# Patient Record
Sex: Male | Born: 2000 | Race: White | Hispanic: No | Marital: Single | Smoking: Never smoker
Health system: Southern US, Community
[De-identification: ages and names within clinical notes are randomized; demographics above are authoritative.]

## PROBLEM LIST (undated history)

## (undated) DIAGNOSIS — K219 Gastro-esophageal reflux disease without esophagitis: Secondary | ICD-10-CM

## (undated) DIAGNOSIS — F419 Anxiety disorder, unspecified: Secondary | ICD-10-CM

## (undated) HISTORY — DX: Gastro-esophageal reflux disease without esophagitis: K21.9

## (undated) HISTORY — PX: NO PAST SURGERIES: SHX2092

## (undated) HISTORY — DX: Anxiety disorder, unspecified: F41.9

---
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
## 2021-07-21 ENCOUNTER — Other Ambulatory Visit: Payer: Self-pay

## 2021-07-21 ENCOUNTER — Emergency Department (HOSPITAL_COMMUNITY): Payer: Self-pay

## 2021-07-21 ENCOUNTER — Encounter (HOSPITAL_COMMUNITY): Payer: Self-pay

## 2021-07-21 ENCOUNTER — Emergency Department (HOSPITAL_COMMUNITY)
Admission: EM | Admit: 2021-07-21 | Discharge: 2021-07-22 | Disposition: A | Discharge status: Home / Self Care | Payer: Self-pay | Attending: Emergency Medicine | Admitting: Emergency Medicine

## 2021-07-21 DIAGNOSIS — R1012 Left upper quadrant pain: Secondary | ICD-10-CM | POA: Insufficient documentation

## 2021-07-21 DIAGNOSIS — R110 Nausea: Secondary | ICD-10-CM | POA: Insufficient documentation

## 2021-07-21 LAB — CBC WITH DIFFERENTIAL/PLATELET
Abs Immature Granulocytes: 0.01 10*3/uL (ref 0.00–0.07)
Basophils Absolute: 0 10*3/uL (ref 0.0–0.1)
Basophils Relative: 1 %
Eosinophils Absolute: 0.1 10*3/uL (ref 0.0–0.5)
Eosinophils Relative: 2 %
HCT: 42.9 % (ref 39.0–52.0)
Hemoglobin: 15.1 g/dL (ref 13.0–17.0)
Immature Granulocytes: 0 %
Lymphocytes Relative: 32 %
Lymphs Abs: 1.7 10*3/uL (ref 0.7–4.0)
MCH: 30.9 pg (ref 26.0–34.0)
MCHC: 35.2 g/dL (ref 30.0–36.0)
MCV: 87.9 fL (ref 80.0–100.0)
Monocytes Absolute: 0.5 10*3/uL (ref 0.1–1.0)
Monocytes Relative: 9 %
Neutro Abs: 3 10*3/uL (ref 1.7–7.7)
Neutrophils Relative %: 56 %
Platelets: 289 10*3/uL (ref 150–400)
RBC: 4.88 MIL/uL (ref 4.22–5.81)
RDW: 12.5 % (ref 11.5–15.5)
WBC: 5.4 10*3/uL (ref 4.0–10.5)
nRBC: 0 % (ref 0.0–0.2)

## 2021-07-21 LAB — URINALYSIS, ROUTINE W REFLEX MICROSCOPIC
Bilirubin Urine: NEGATIVE
Glucose, UA: NEGATIVE mg/dL
Hgb urine dipstick: NEGATIVE
Ketones, ur: NEGATIVE mg/dL
Leukocytes,Ua: NEGATIVE
Nitrite: NEGATIVE
Protein, ur: NEGATIVE mg/dL
Specific Gravity, Urine: 1.009 (ref 1.005–1.030)
pH: 7 (ref 5.0–8.0)

## 2021-07-21 LAB — COMPREHENSIVE METABOLIC PANEL
ALT: 10 U/L (ref 0–44)
AST: 15 U/L (ref 15–41)
Albumin: 4.6 g/dL (ref 3.5–5.0)
Alkaline Phosphatase: 55 U/L (ref 38–126)
Anion gap: 11 (ref 5–15)
BUN: 9 mg/dL (ref 6–20)
CO2: 26 mmol/L (ref 22–32)
Calcium: 9.7 mg/dL (ref 8.9–10.3)
Chloride: 101 mmol/L (ref 98–111)
Creatinine, Ser: 0.94 mg/dL (ref 0.61–1.24)
GFR, Estimated: >60 mL/min (ref >60)
Glucose, Bld: 95 mg/dL (ref 70–99)
Potassium: 4.1 mmol/L (ref 3.5–5.1)
Sodium: 138 mmol/L (ref 135–145)
Total Bilirubin: 0.8 mg/dL (ref 0.3–1.2)
Total Protein: 7 g/dL (ref 6.5–8.1)

## 2021-07-21 LAB — LIPASE, BLOOD: Lipase: 24 U/L (ref 11–51)

## 2021-07-21 MED ORDER — ONDANSETRON 4 MG PO TBDP
4.0000 mg | ORAL_TABLET | Freq: Once | ORAL | Status: AC
  Administered 2021-07-22: 4 mg via ORAL
  Filled 2021-07-21: qty 1

## 2021-07-21 NOTE — ED Triage Notes (Signed)
Pt reports LUQ abd pain x 1 week, worse after eating, associated with nausea. Denies emesis/diarrhea.

## 2021-07-21 NOTE — ED Provider Triage Note (Signed)
Emergency Medicine Provider Triage Evaluation Note  Wesley Shaw , a 21 y.o. male  was evaluated in triage.  Pt complains of left upper quadrant abdominal pain.  He states that this has been ongoing for 1 week.  He describes it as constant and severe.  He states that the pain is stabbing and intermittently radiates to his left back.  Has had nausea without vomiting or diarrhea.  Denies any trauma to the abdomen.  Denies fevers.  He does endorse a cough that began about 1 week ago but is dry.  He denies any shortness of breath or palpitations or chest pain.  Denies alcohol use, chronic Tylenol or NSAID use.  Review of Systems  Positive: See above Negative:   Physical Exam  BP (!) 135/102 (BP Location: Right Arm)   Pulse 81   Temp 98.4 F (36.9 C) (Oral)   Resp 18   Ht 6' (1.829 m)   Wt 68 kg   SpO2 100%   BMI 20.34 kg/m  Gen:   Awake, no distress   Resp:  Normal effort, lungs clear in the bases. MSK:   Moves slowly Other:  Abdomen is flat and soft.  Bowel sounds are present.  He does have tenderness to palpation of the left upper quadrant over the left ribs  Medical Decision Making  Medically screening exam initiated at 10:33 PM.  Appropriate orders placed.  Macai Sisneros was informed that the remainder of the evaluation will be completed by another provider, this initial triage assessment does not replace that evaluation, and the importance of remaining in the ED until their evaluation is complete.     Cristopher Peru, PA-C 07/21/21 2235

## 2021-07-22 ENCOUNTER — Emergency Department (HOSPITAL_COMMUNITY): Payer: Self-pay

## 2021-07-22 LAB — CBG MONITORING, ED: Glucose-Capillary: 72 mg/dL (ref 70–99)

## 2021-07-22 MED ORDER — SUCRALFATE 1 G PO TABS: 1.0000 g | ORAL_TABLET | Freq: Three times a day (TID) | ORAL | 0 refills | Status: DC

## 2021-07-22 MED ORDER — DICYCLOMINE HCL 20 MG PO TABS: 20.0000 mg | ORAL_TABLET | Freq: Three times a day (TID) | ORAL | 0 refills | Status: DC | PRN

## 2021-07-22 MED ORDER — IOHEXOL 300 MG/ML  SOLN
100.0000 mL | Freq: Once | INTRAMUSCULAR | Status: AC | PRN
  Administered 2021-07-22: 100 mL via INTRAVENOUS

## 2021-07-22 MED ORDER — ONDANSETRON 4 MG PO TBDP: 4.0000 mg | ORAL_TABLET | Freq: Three times a day (TID) | ORAL | 0 refills | Status: DC | PRN

## 2021-07-22 NOTE — Discharge Instructions (Signed)

## 2021-07-22 NOTE — ED Notes (Signed)
Patient transported to CT scan . 

## 2021-07-22 NOTE — ED Provider Notes (Signed)
Emergency Department Provider Note   I have reviewed the triage vital signs and the nursing notes.   HISTORY  Chief Complaint Abdominal Pain   HPI Wesley Shaw is a 21 y.o. male presents to the emergency department with left upper quadrant abdominal pain and pain with swallowing.  Patient describes symptoms worsening over the past week and worse with eating.  He has some associated nausea but no vomiting or diarrhea.  Notes that he has had similar symptoms in the past and has had prior work-ups including CT imaging in August and upper endoscopy which [place].  He has established with GI locally and actually saw them recently with plan for reevaluation in August for possible upper endoscopy.  Notes previous diagnosis of small hiatal hernia.  He is currently taking Protonix as prescribed.  No fevers or chills.  No chest discomfort or shortness of breath.   History reviewed. No pertinent past medical history.  Review of Systems  Constitutional: No fever/chills Eyes: No visual changes. ENT: No sore throat. Cardiovascular: Denies chest pain. Respiratory: Denies shortness of breath. Gastrointestinal: Positive LUQ abdominal pain. Positive nausea, no vomiting.  No diarrhea.  No constipation. Genitourinary: Negative for dysuria. Musculoskeletal: Negative for back pain. Skin: Negative for rash. Neurological: Negative for headaches, focal weakness or numbness.   ____________________________________________   PHYSICAL EXAM:  VITAL SIGNS: ED Triage Vitals  Enc Vitals Group     BP 07/21/21 2231 (!) 135/102     Pulse Rate 07/21/21 2231 81     Resp 07/21/21 2231 18     Temp 07/21/21 2231 98.4 F (36.9 C)     Temp Source 07/21/21 2231 Oral     SpO2 07/21/21 2231 100 %     Weight 07/21/21 2232 150 lb (68 kg)     Height 07/21/21 2232 6' (1.829 m)   Constitutional: Alert and oriented. Well appearing and in no acute distress. Eyes:  Conjunctivae are normal. Head: Atraumatic. Nose: No congestion/rhinnorhea. Mouth/Throat: Mucous membranes are moist.  Neck: No stridor.   Cardiovascular: Normal rate, regular rhythm. Good peripheral circulation. Grossly normal heart sounds.   Respiratory: Normal respiratory effort.  No retractions. Lungs CTAB. Gastrointestinal: Soft with mild LUQ tenderness. No peritonitis. No distention.  Musculoskeletal: No lower extremity tenderness nor edema. No gross deformities of extremities. Neurologic:  Normal speech and language.  Skin:  Skin is warm, dry and intact. No rash noted.   ____________________________________________   LABS (all labs ordered are listed, but only abnormal results are displayed)  Labs Reviewed  COMPREHENSIVE METABOLIC PANEL  LIPASE, BLOOD  CBC WITH DIFFERENTIAL/PLATELET  URINALYSIS, ROUTINE W REFLEX MICROSCOPIC  CBG MONITORING, ED   ____________________________________________  RADIOLOGY  CT ABDOMEN PELVIS W CONTRAST  Result Date: 07/22/2021 CLINICAL DATA:  21 year old male with history of acute onset of left upper quadrant abdominal pain. EXAM: CT ABDOMEN AND PELVIS WITH CONTRAST TECHNIQUE: Multidetector CT imaging of the abdomen and pelvis was performed using the standard protocol following bolus administration of intravenous contrast. RADIATION DOSE REDUCTION: This exam was performed according to the departmental dose-optimization program which includes automated exposure control, adjustment of the mA and/or kV according to patient size and/or use of iterative reconstruction technique. CONTRAST:  OMNIPAQUE IOHEXOL 300 MG/ML  SOLN COMPARISON:  No priors. FINDINGS: Lower chest: Unremarkable. Hepatobiliary: No suspicious cystic or solid hepatic lesions. No intra or extrahepatic biliary ductal dilatation. Gallbladder is normal in appearance. Pancreas: No pancreatic mass. No pancreatic ductal dilatation. No pancreatic  or peripancreatic fluid collections or  inflammatory changes. Spleen: Unremarkable. Adrenals/Urinary Tract: Bilateral kidneys and adrenal glands are normal in appearance. No hydroureteronephrosis. Urinary bladder is normal in appearance. Stomach/Bowel: The appearance of the stomach is normal. There is no pathologic dilatation of small bowel or colon. Normal appendix. Vascular/Lymphatic: No significant atherosclerotic disease, aneurysm or dissection noted in the abdominal or pelvic vasculature. No lymphadenopathy noted in the abdomen or pelvis. Reproductive: Prostate gland and seminal vesicles are unremarkable in appearance. Other: No significant volume of ascites.  No pneumoperitoneum. Musculoskeletal: There are no aggressive appearing lytic or blastic lesions noted in the visualized portions of the skeleton. IMPRESSION: 1. No acute findings are noted in the abdomen or pelvis to account for the patient's symptoms. Electronically Signed   By: Vinnie Langton M.D.   On: 07/22/2021 06:08   DG Chest 1 View  Result Date: 07/21/2021 CLINICAL DATA:  Cough.  Lower chest and abdominal pain. EXAM: CHEST  1 VIEW COMPARISON:  None Available. FINDINGS: The lungs are hyperinflated. No focal airspace disease. Heart is normal in size with normal mediastinal contours. No pneumothorax or large pleural effusion. Blunting of the costophrenic angles typically due to hyperinflation. No acute osseous abnormalities are seen. IMPRESSION: Hyperinflation suggesting asthma or bronchitis. Electronically Signed   By: Keith Rake M.D.   On: 07/21/2021 23:05    ____________________________________________   PROCEDURES  Procedure(s) performed:   Procedures  None ____________________________________________   INITIAL IMPRESSION / ASSESSMENT AND PLAN / ED COURSE  Pertinent labs & imaging results that were available during my care of the patient were reviewed by me and considered in my medical decision making (see chart for details).   This patient is Presenting  for Evaluation of abdominal pain, which does require a range of treatment options, and is a complaint that involves a high risk of morbidity and mortality.  The Differential Diagnoses includes but is not exclusive to acute cholecystitis, intrathoracic causes for epigastric abdominal pain, gastritis, duodenitis, pancreatitis, small bowel or large bowel obstruction, abdominal aortic aneurysm, hernia, gastritis, etc.   Critical Interventions-    Medications  ondansetron (ZOFRAN-ODT) disintegrating tablet 4 mg (4 mg Oral Given 07/22/21 0355)  iohexol (OMNIPAQUE) 300 MG/ML solution 100 mL (100 mLs Intravenous Contrast Given 07/22/21 0533)    Reassessment after intervention: Symptoms improved.    I decided to review pertinent External Data, and in summary patient's last ED visit was 2015 with abdominal pain as CC at that time as well.   Clinical Laboratory Tests Ordered, included normal lipase, LFTs, bilirubin.  UA negative.  No leukocytosis or anemia.  No acute kidney injury.  Radiologic Tests Ordered, included CXR and CT abdomen/pelvis. I independently interpreted the images and agree with radiology interpretation.   Cardiac Monitor Tracing which shows NSR.   Social Determinants of Health Risk no smoking.   Medical Decision Making: Summary:  Patient presents emergency department with abdominal pain and nausea, worse with eating.  Seems most consistent with esophagitis/gastritis but does have some focal tenderness on abdominal exam.  No recent abdominal imaging in our system.  Plan for CT which, as above, no acute findings.   Reevaluation with update and discussion with patient.  Plan for symptom management and close follow-up with GI. Discussed strict ED return precautions.   Considered admission but labs and imaging are reassuring.  Patient with GI follow-up as an outpatient.  Disposition: discharge  ____________________________________________  FINAL CLINICAL IMPRESSION(S) / ED  DIAGNOSES  Final diagnoses:  Left upper quadrant abdominal  pain     NEW OUTPATIENT MEDICATIONS STARTED DURING THIS VISIT:  New Prescriptions   DICYCLOMINE (BENTYL) 20 MG TABLET    Take 1 tablet (20 mg total) by mouth 3 (three) times daily as needed for spasms.   ONDANSETRON (ZOFRAN-ODT) 4 MG DISINTEGRATING TABLET    Take 1 tablet (4 mg total) by mouth every 8 (eight) hours as needed for nausea or vomiting.   SUCRALFATE (CARAFATE) 1 G TABLET    Take 1 tablet (1 g total) by mouth 4 (four) times daily -  with meals and at bedtime for 7 days.    Note:  This document was prepared using Dragon voice recognition software and may include unintentional dictation errors.  Alona Bene, MD, [facility] Emergency Medicine    Taelyr Jantz, Arlyss Repress, MD 07/22/21 [phone]

## 2023-12-20 ENCOUNTER — Other Ambulatory Visit: Payer: Self-pay

## 2023-12-20 ENCOUNTER — Emergency Department (HOSPITAL_BASED_OUTPATIENT_CLINIC_OR_DEPARTMENT_OTHER)
Admission: EM | Admit: 2023-12-20 | Discharge: 2023-12-20 | Disposition: A | Discharge status: Home / Self Care | Payer: Self-pay | Attending: Emergency Medicine | Admitting: Emergency Medicine

## 2023-12-20 DIAGNOSIS — R197 Diarrhea, unspecified: Secondary | ICD-10-CM

## 2023-12-20 LAB — CBC WITH DIFFERENTIAL/PLATELET
Abs Immature Granulocytes: 0.01 K/uL (ref 0.00–0.07)
Basophils Absolute: 0 K/uL (ref 0.0–0.1)
Basophils Relative: 0 %
Eosinophils Absolute: 0.2 K/uL (ref 0.0–0.5)
Eosinophils Relative: 3 %
HCT: 41.6 % (ref 39.0–52.0)
Hemoglobin: 15.1 g/dL (ref 13.0–17.0)
Immature Granulocytes: 0 %
Lymphocytes Relative: 23 %
Lymphs Abs: 1.6 K/uL (ref 0.7–4.0)
MCH: 31.4 pg (ref 26.0–34.0)
MCHC: 36.3 g/dL — ABNORMAL HIGH (ref 30.0–36.0)
MCV: 86.5 fL (ref 80.0–100.0)
Monocytes Absolute: 0.8 K/uL (ref 0.1–1.0)
Monocytes Relative: 11 %
Neutro Abs: 4.2 K/uL (ref 1.7–7.7)
Neutrophils Relative %: 63 %
Platelets: 253 K/uL (ref 150–400)
RBC: 4.81 MIL/uL (ref 4.22–5.81)
RDW: 12.4 % (ref 11.5–15.5)
WBC: 6.8 K/uL (ref 4.0–10.5)
nRBC: 0 % (ref 0.0–0.2)

## 2023-12-20 LAB — COMPREHENSIVE METABOLIC PANEL WITH GFR
ALT: 34 U/L (ref 0–44)
AST: 35 U/L (ref 15–41)
Albumin: 4.9 g/dL (ref 3.5–5.0)
Alkaline Phosphatase: 72 U/L (ref 38–126)
Anion gap: 11 (ref 5–15)
BUN: <5 mg/dL — ABNORMAL LOW (ref 6–20)
CO2: 29 mmol/L (ref 22–32)
Calcium: 10.3 mg/dL (ref 8.9–10.3)
Chloride: 100 mmol/L (ref 98–111)
Creatinine, Ser: 0.82 mg/dL (ref 0.61–1.24)
GFR, Estimated: >60 mL/min (ref >60)
Glucose, Bld: 103 mg/dL — ABNORMAL HIGH (ref 70–99)
Potassium: 3.8 mmol/L (ref 3.5–5.1)
Sodium: 140 mmol/L (ref 135–145)
Total Bilirubin: 1 mg/dL (ref 0.0–1.2)
Total Protein: 7.8 g/dL (ref 6.5–8.1)

## 2023-12-20 MED ORDER — DIPHENOXYLATE-ATROPINE 2.5-0.025 MG PO TABS: 1.0000 | ORAL_TABLET | Freq: Four times a day (QID) | ORAL | 0 refills | Status: DC | PRN

## 2023-12-20 NOTE — ED Triage Notes (Signed)
 Patient reports diarrhea starting 1 week ago. He says prior he was taking Augmentin for an infection. His friends who are in health care said that it could be C-diff, so he got scared and was seen at an urgent care where they diagnosed him with colitis and said to follow up with his PCP which he does not have.

## 2023-12-20 NOTE — ED Provider Notes (Signed)
 [place] EMERGENCY DEPARTMENT AT [facility] Provider Note   CSN: [id number] Arrival date & time: 12/20/23  2117     Patient presents with: Diarrhea   Wesley Shaw is a 23 y.o. male.   Patient presents to the emergency department for evaluation of diarrhea.  Symptoms started about 1 week ago.  He reports waking 1 day with nausea, vomiting, diarrhea.  Nausea persisted for a couple of days but then resolved.  Diarrhea has been persistent.  Multiple watery stools, patient describes as translucent, per day.  Patient reports foul odor with the stool.  Taking Pepto-Bismol and Imodium without improvement.  No abdominal pain, just some bloating in the lower abdomen.  Prior to this starting, patient took a course of Augmentin for a finger infection.  No fevers or travel.  No suspicious food or water exposures.       Prior to Admission medications   Medication Sig Start Date End Date Taking? Authorizing Provider  dicyclomine  (BENTYL ) 20 MG tablet Take 1 tablet (20 mg total) by mouth 3 (three) times daily as needed for spasms. 07/22/21   Long, Fonda MATSU, MD  ondansetron  (ZOFRAN -ODT) 4 MG disintegrating tablet Take 1 tablet (4 mg total) by mouth every 8 (eight) hours as needed for nausea or vomiting. 07/22/21   Long, Fonda MATSU, MD  sucralfate  (CARAFATE ) 1 g tablet Take 1 tablet (1 g total) by mouth 4 (four) times daily -  with meals and at bedtime for 7 days. 07/22/21 07/29/21  Long, Kayline Sheer G, MD    Allergies: Patient has no known allergies.    Review of Systems  Updated Vital Signs BP (!) 156/81   Pulse 87   Temp 98.1 F (36.7 C) (Oral)   Resp 18   SpO2 100%   Physical Exam Vitals and nursing note reviewed.  Constitutional:      General: He is not in acute distress.    Appearance: He is well-developed.  HENT:     Head: Normocephalic and atraumatic.     Nose: Nose normal.     Mouth/Throat:     Mouth: Mucous membranes are moist.     Comments: No clinical dehydration Eyes:      General:        Right eye: No discharge.        Left eye: No discharge.     Conjunctiva/sclera: Conjunctivae normal.  Cardiovascular:     Rate and Rhythm: Normal rate and regular rhythm.     Heart sounds: Normal heart sounds.  Pulmonary:     Effort: Pulmonary effort is normal.     Breath sounds: Normal breath sounds.  Abdominal:     Palpations: Abdomen is soft.     Tenderness: There is no abdominal tenderness.  Musculoskeletal:     Cervical back: Normal range of motion and neck supple.  Skin:    General: Skin is warm and dry.  Neurological:     Mental Status: He is alert.     (all labs ordered are listed, but only abnormal results are displayed) Labs Reviewed  CBC WITH DIFFERENTIAL/PLATELET - Abnormal; Notable for the following components:      Result Value   MCHC 36.3 (*)    All other components within normal limits  COMPREHENSIVE METABOLIC PANEL WITH GFR - Abnormal; Notable for the following components:   Glucose, Bld 103 (*)    BUN <5 (*)    All other components within normal limits  GASTROINTESTINAL PANEL BY PCR, STOOL (REPLACES STOOL  CULTURE)  C DIFFICILE QUICK SCREEN W PCR REFLEX      EKG: None  Radiology: No results found.   Procedures   Medications Ordered in the ED - No data to display  ED Course  Patient seen and examined. History obtained directly from patient.   Labs/EKG: Ordered CBC, CMP, GI pathogen panel and C. difficile testing.  Imaging: None ordered  Medications/Fluids: None ordered  Most recent vital signs reviewed and are as follows: BP (!) 156/81   Pulse 87   Temp 98.1 F (36.7 C) (Oral)   Resp 18   SpO2 100%   Initial impression: Watery diarrhea, doubt clinically significant dehydration at this time given vital signs and clinical appearance.  Labs pending.  11:03 PM Reassessment performed. Patient appears comfortable.  He was able to give a stool sample.  Labs personally reviewed and interpreted including: CBC  unremarkable; CMP unremarkable.  Reviewed pertinent lab work and imaging with patient at bedside. Questions answered.   Most current vital signs reviewed and are as follows: BP (!) 156/81   Pulse 87   Temp 98.1 F (36.7 C) (Oral)   Resp 18   SpO2 100%   Plan: Discharge to home.   Prescriptions written for: Lomotil   Other home care instructions discussed: Maintain good hydration, continue home treatments for diarrhea.  ED return instructions discussed: The patient was urged to return to the Emergency Department immediately with worsening of current symptoms, worsening abdominal pain, persistent vomiting, blood noted in stools, fever, or any other concerns. The patient verbalized understanding.   Follow-up instructions discussed: Patient encouraged to follow-up with their PCP in 3 days if not improving.                                    Medical Decision Making Amount and/or Complexity of Data Reviewed Labs: ordered.  Risk Prescription drug management.   Patient with diarrhea ongoing for a week, potential for C. difficile, however lab workup is reassuring and patient without any evidence of significant dehydration, electrolyte derangement.  Abdomen is soft and nontender.  Patient was able to provide a stool sample today, that he did not have a lot of watery stool here.  Minimal benefit with Pepto-Bismol and Imodium.  Will give a prescription for Lomotil .  Encouraged to follow-up as outpatient if symptoms are persistent.  He will follow-up on stool study results.  The patient's vital signs, pertinent lab work and imaging were reviewed and interpreted as discussed in the ED course. Hospitalization was considered for further testing, treatments, or serial exams/observation. However as patient is well-appearing, has a stable exam, and reassuring studies today, I do not feel that they warrant admission at this time. This plan was discussed with the patient who verbalizes agreement and  comfort with this plan and seems reliable and able to return to the Emergency Department with worsening or changing symptoms.        Final diagnoses:  Diarrhea, unspecified type    ED Discharge Orders          Ordered    diphenoxylate -atropine  (LOMOTIL ) 2.5-0.025 MG tablet  4 times daily PRN        12/20/23 2301               Desiderio Chew, PA-C 12/20/23 2306    Freddi Hamilton, MD 12/21/23 1555

## 2023-12-20 NOTE — Discharge Instructions (Addendum)
 Please read and follow all provided instructions.  Your diagnoses today include:  1. Diarrhea, unspecified type     Tests performed today include: Complete blood cell count: No concerning findings Complete metabolic panel: Normal liver and kidney function Stool studies: Pending results Vital signs. See below for your results today.   Medications prescribed:  Lomotil : medication for diarrhea  This medication can make you drowsy, so please do not do any activities where you need to be awake if taking this.  Take any prescribed medications only as directed.  Home care instructions:  Follow any educational materials contained in this packet.  Follow-up instructions: Please follow-up with your primary care provider in the next 3 days for further evaluation of your symptoms if not improving.  Return instructions:  SEEK IMMEDIATE MEDICAL ATTENTION IF: The pain does not go away or becomes severe  A temperature above 101F develops  Repeated vomiting occurs (multiple episodes)  The pain becomes localized to portions of the abdomen. The right side could possibly be appendicitis. In an adult, the left lower portion of the abdomen could be colitis or diverticulitis.  Blood is being passed in stools or vomit (bright red or black tarry stools)  You develop chest pain, difficulty breathing, dizziness or fainting, or become confused, poorly responsive, or inconsolable (young children) If you have any other emergent concerns regarding your health  Additional Information: Abdominal (belly) pain can be caused by many things. Your caregiver performed an examination and possibly ordered blood/urine tests and imaging (CT scan, x-rays, ultrasound). Many cases can be observed and treated at home after initial evaluation in the emergency department. Even though you are being discharged home, abdominal pain can be unpredictable. Therefore, you need a repeated exam if your pain does not resolve, returns, or  worsens. Most patients with abdominal pain don't have to be admitted to the hospital or have surgery, but serious problems like appendicitis and gallbladder attacks can start out as nonspecific pain. Many abdominal conditions cannot be diagnosed in one visit, so follow-up evaluations are very important.  Your vital signs today were: BP (!) 156/81   Pulse 87   Temp 98.1 F (36.7 C) (Oral)   Resp 18   SpO2 100%  If your blood pressure (bp) was elevated above 135/85 this visit, please have this repeated by your doctor within one month. --------------

## 2023-12-22 LAB — GASTROINTESTINAL PANEL BY PCR, STOOL (REPLACES STOOL CULTURE)

## 2023-12-29 ENCOUNTER — Encounter: Payer: Self-pay | Admitting: Gastroenterology

## 2023-12-29 ENCOUNTER — Ambulatory Visit: Payer: Self-pay | Admitting: Gastroenterology

## 2023-12-29 VITALS — BP 100/50 | HR 80 | Ht 71.25 in | Wt 156.5 lb

## 2023-12-29 DIAGNOSIS — A09 Infectious gastroenteritis and colitis, unspecified: Secondary | ICD-10-CM

## 2023-12-29 DIAGNOSIS — Z7689 Persons encountering health services in other specified circumstances: Secondary | ICD-10-CM

## 2023-12-29 NOTE — Patient Instructions (Addendum)
 _______________________________________________________  If your blood pressure at your visit was 140/90 or greater, please contact your primary care physician to follow up on this.  _______________________________________________________  If you are age 22 or older, your body mass index should be between 23-30. Your Body mass index is 21.67 kg/m. If this is out of the aforementioned range listed, please consider follow up with your Primary Care Provider.  If you are age 39 or younger, your body mass index should be between 19-25. Your Body mass index is 21.67 kg/m. If this is out of the aformentioned range listed, please consider follow up with your Primary Care Provider.   ________________________________________________________  The [place] providers would like to encourage you to use MYCHART to communicate with providers for non-urgent requests or questions.  Due to long hold times on the telephone, sending your provider a message by [facility] may be a faster and more efficient way to get a response.  Please allow 48 business hours for a response.  Please remember that this is for non-urgent requests.  _______________________________________________________  [facility] is using a team-based approach to care.  Your team is made up of your doctor and two to three APPS. Our APPS (Nurse Practitioners and Physician Assistants) work with your physician to ensure care continuity for you. They are fully qualified to address your health concerns and develop a treatment plan. They communicate directly with your gastroenterologist to care for you. Seeing the Advanced Practice Practitioners on your physician's team can help you by facilitating care more promptly, often allowing for earlier appointments, access to diagnostic testing, procedures, and other specialty referrals.   We have referred you to [facility].  Someone should contact you with an appointment.  If  you have not heard from their office in 1 to 2 weeks, please let our office know.    It was a pleasure to see you today!  Vito Cirigliano, D.O.

## 2023-12-29 NOTE — Progress Notes (Signed)
 Chief Complaint: Diarrhea, ER follow-up   Referring Provider:     [facility]    HPI:     Wesley Shaw is a 23 y.o. male referred to the Gastroenterology Clinic for evaluation of diarrhea.    Was having 5-6 nonbloody stools daily x 7 days.  +Abdominal cramping.  Had n/v on day 1 which then resolved within 24 hours. No hematochezia.  Was evaluated at urgent care on 12/18/2023 then the ER 12/20/2023 for this issue with workup as outlined below.  Symptoms have since resolved.  No prior similar symptoms.  No known sick contacts, travel, change in medications, antibiotics, etc. Used imodium, Pepto.   12/18/2023: - AST 42, otherwise normal CMP  12/20/2023: - Normal CBC, CMP - GI PCR panel: Adenovirus+, remainder negative/normal  Now, back to baseline health and good PO intake. Normal stools now.   No prior GI hx. Asking for referral to establish with PCP.    Past Medical History:  Diagnosis Date   Anxiety      Past Surgical History:  Procedure Laterality Date   NO PAST SURGERIES     Family History  Problem Relation Age of Onset   Breast cancer Maternal Grandmother    Social History[1] Current Outpatient Medications  Medication Sig Dispense Refill   bismuth subsalicylate (PEPTO BISMOL) 262 MG chewable tablet Chew 524 mg by mouth as needed.     diphenoxylate -atropine  (LOMOTIL ) 2.5-0.025 MG tablet Take 1 tablet by mouth 4 (four) times daily as needed for diarrhea or loose stools. 20 tablet 0   famotidine (PEPCID) 40 MG tablet Take 40 mg by mouth daily.     No current facility-administered medications for this visit.   Allergies[2]   Review of Systems: All systems reviewed and negative except where noted in HPI.     Physical Exam:    Wt Readings from Last 3 Encounters:  12/29/23 156 lb 8 oz (71 kg)  07/21/21 150 lb (68 kg)  07/18/13 88 lb 12.8 oz (40.3 kg) (39%, Z= -0.28)*   * Growth percentiles are based on CDC (Boys, 2-20 Years) data.    BP  (!) 100/50 (BP Location: Left Arm, Patient Position: Sitting, Cuff Size: Normal)   Pulse 80   Ht 5' 11.25 (1.81 m) Comment: height measured without shoes  Wt 156 lb 8 oz (71 kg)   BMI 21.67 kg/m  Constitutional:  Pleasant, in no acute distress. Psychiatric: Normal mood and affect. Behavior is normal. Cardiovascular: Normal rate, regular rhythm. No edema Pulmonary/chest: Effort normal and breath sounds normal. No wheezing, rales or rhonchi. Abdominal: Soft, nondistended, nontender. Bowel sounds active throughout. There are no masses palpable. No hepatomegaly. Neurological: Alert and oriented to person place and time. Skin: Skin is warm and dry. No rashes noted.   ASSESSMENT AND PLAN;   1) Infectious diarrhea Self-limiting infectious diarrhea with PCR panel + for adenovirus.  Symptoms have since resolved. - Continue advancing diet as tolerated - To call the office if recurrence of symptoms - Placed referral to [place] [facility] to establish care    RTC prn    Ander Wamser V Burwell Bethel, DO, FACG  12/29/2023, 9:43 AM   No ref. provider found     [1]  Social History Tobacco Use   Smoking status: Never   Smokeless tobacco: Never  Vaping Use   Vaping status: Every Day   Substances: Nicotine  Substance Use Topics   Alcohol use:  Yes    Comment: 3-4 per day   Drug use: Never  [2] No Known Allergies

## 2024-01-28 ENCOUNTER — Encounter (HOSPITAL_BASED_OUTPATIENT_CLINIC_OR_DEPARTMENT_OTHER): Payer: Self-pay

## 2024-01-28 ENCOUNTER — Other Ambulatory Visit (HOSPITAL_BASED_OUTPATIENT_CLINIC_OR_DEPARTMENT_OTHER): Payer: Self-pay

## 2024-01-28 ENCOUNTER — Ambulatory Visit (INDEPENDENT_AMBULATORY_CARE_PROVIDER_SITE_OTHER)

## 2024-01-28 VITALS — BP 137/78 | HR 98 | Ht 71.0 in | Wt 157.0 lb

## 2024-01-28 DIAGNOSIS — F411 Generalized anxiety disorder: Secondary | ICD-10-CM

## 2024-01-28 DIAGNOSIS — K219 Gastro-esophageal reflux disease without esophagitis: Secondary | ICD-10-CM | POA: Diagnosis not present

## 2024-01-28 DIAGNOSIS — Z7689 Persons encountering health services in other specified circumstances: Secondary | ICD-10-CM

## 2024-01-28 MED ORDER — BUSPIRONE HCL 5 MG PO TABS: 5.0000 mg | ORAL_TABLET | Freq: Two times a day (BID) | ORAL | 0 refills | Status: AC

## 2024-01-28 MED ORDER — PANTOPRAZOLE SODIUM 40 MG PO TBEC: 40.0000 mg | DELAYED_RELEASE_TABLET | Freq: Every day | ORAL | 3 refills | Status: AC

## 2024-01-28 NOTE — Progress Notes (Signed)
 "   New Patient Office Visit  Subjective:   Wesley Shaw 04-21-2000 01/28/2024  Chief Complaint  Patient presents with   New Patient (Initial Visit)    Patient is here to establish care with PCP. Patient states he would like 2 refills on medications. Patient has some issues of anxiety and is interested in Buspar .     HPI: Wesley Shaw presents today to establish care at Primary Care and [facility] Medicine at [facility]. Introduced to publishing rights manager role and practice setting with verbalized understanding by patient.  All questions answered.   Last PCP: years ago Last annual physical: 2025 for current job Concerns: See below   GERD:  Requesting to switch back to Protonix  as he was unable to get the prescription due to lack of insurance. Has been using Pepcid with minimal relief and is requesting going back to Protonix .   Anxiety: States he had really bad anxiety in middle school and was never treated for it. However, symptoms resolved until more recently. 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 States he notices symptoms worse in the morning causing him to gag. States he has noticed when he has to go to the store, his body starts to panic for no known reason. States his symptoms are now more tied to his gut in which he feels he is going to get sick.  Patient feels like his anxiety is not to the point where it affects his everyday life.  Patient denies any SI or HI.  Patient just states he feels like he needs something to quiet his brain.   The following portions of the patient's history were reviewed and updated as appropriate: past medical history, past surgical history, family history, social history, allergies, medications, and problem list.   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   Drug use: Never   Sexual activity: Yes    Birth control/protection: I.U.D.  Other Topics Concern   Not on file  Social History Narrative   Not on file   Social Drivers of Health   Tobacco Use: High Risk (01/28/2024)   Patient History    Smoking Tobacco Use: Every Day    Smokeless Tobacco Use: Never    Passive Exposure: Not on file  Financial Resource Strain: Low Risk (01/24/2024)  Overall Financial Resource Strain (CARDIA)    Difficulty of Paying Living Expenses: Not hard at all  Food Insecurity: No Food Insecurity (01/24/2024)   Epic    Worried About Programme Researcher, Broadcasting/film/video in the Last Year: Never true    Ran Out of Food in the Last Year: Never true  Transportation Needs: No Transportation Needs (01/24/2024)   Epic    Lack of Transportation (Medical): No    Lack of Transportation (Non-Medical): No  Physical Activity: Inactive (01/24/2024)   Exercise Vital Sign    Days of Exercise per Week: 0 days    Minutes of Exercise per Session: Not on file  Stress: Stress Concern Present (01/24/2024)   [employer] of Occupational Health - Occupational  Stress Questionnaire    Feeling of Stress: Very much  Social Connections: Socially Isolated (01/24/2024)   Social Connection and Isolation Panel    Frequency of Communication with Friends and Family: Three times a week    Frequency of Social Gatherings with Friends and Family: More than three times a week    Attends Religious Services: Never    Database Administrator or Organizations: No    Attends Engineer, Structural: Not on file    Marital Status: Never married  Intimate Partner Violence: Not on file  Depression (PHQ2-9): Low Risk (01/28/2024)   Depression (PHQ2-9)    PHQ-2 Score: 0  Alcohol Screen: High Risk (01/24/2024)   Alcohol Screen    Last Alcohol Screening Score (AUDIT): 18  Housing: Low Risk (01/24/2024)   Epic    Unable to Pay for Housing in the Last Year: No    Number of Times Moved in the Last Year: 0    Homeless in the Last Year: No  Utilities: Not At Risk (01/28/2024)   Epic    Threatened with loss of utilities: No  Health Literacy: Adequate Health Literacy (01/28/2024)   B1300 Health Literacy    Frequency of need for help with medical instructions: Never   Outpatient Medications Prior to Visit  Medication Sig Dispense Refill   famotidine (PEPCID) 40 MG tablet Take 40 mg by mouth daily.     ondansetron  (ZOFRAN ) 4 MG tablet Take 4 mg by mouth every 8 (eight) hours as needed for nausea or vomiting.     bismuth subsalicylate (PEPTO BISMOL) 262 MG chewable tablet Chew 524 mg by mouth as needed. (Patient not taking: Reported on 01/28/2024)     diphenoxylate -atropine  (LOMOTIL ) 2.5-0.025 MG tablet Take 1 tablet by mouth 4 (four) times daily as needed for diarrhea or loose stools. (Patient not taking: Reported on 01/28/2024) 20 tablet 0   No facility-administered medications prior to visit.   Allergies[1]  ROS: A complete ROS was performed with pertinent positives/negatives noted in the HPI. The remainder of the ROS are negative.   Objective:   Today's Vitals    01/28/24 1003  BP: 137/78  Pulse: 98  SpO2: 98%  Weight: 157 lb (71.2 kg)  Height: 5' 11 (1.803 m)    GENERAL: Well-appearing, in NAD. Well nourished. RESPIRATORY: Chest wall symmetrical. Respirations even and non-labored. Breath sounds clear to auscultation bilaterally.  CARDIAC: S1, S2 present, regular rate and rhythm without murmur or gallops. Peripheral pulses 2+ bilaterally.  MSK: Muscle tone and strength appropriate for age. Joints w/o tenderness, redness, or swelling.  EXTREMITIES: Without clubbing, cyanosis, or edema.  NEUROLOGIC: No motor or sensory deficits. Steady, even gait. C2-C12 intact.  PSYCH/MENTAL STATUS: Alert, oriented x 3. Cooperative, appropriate mood  and affect.    Health Maintenance Due  Topic Date Due   HPV VACCINES (1 - Male 3-dose series) Never done   HIV Screening  Never done   Meningococcal B Vaccine (1 of 2 - Standard) Never done   Hepatitis C Screening  Never done   DTaP/Tdap/Td (1 - Tdap) Never done   Pneumococcal Vaccine (1 of 2 - PCV) Never done   Hepatitis B Vaccines 19-59 Average Risk (1 of 3 - 19+ 3-dose series) Never done   COVID-19 Vaccine (1 - 2025-26 season) Never done    No results found for any visits on 01/28/24.     Assessment & Plan:  1. Encounter to establish care with new doctor (Primary) Discussed role of NP and expectations of the Primary Care Clinic. Discussed medical, surgical, and family history.    2. Gastroesophageal reflux disease without esophagitis Discussed switching patient's medication back to Protonix  daily.  Patient to stop taking Pepcid as we are now replacing with his previous medication in which his GERD was well-controlled.  Patient to closely follow-up with PCP if no improvement is made. - pantoprazole  (PROTONIX ) 40 MG tablet; Take 1 tablet (40 mg total) by mouth daily.  Dispense: 30 tablet; Refill: 3  3. Generalized anxiety disorder Discussed use of medication to help aid in his anxiety symptoms.  Patient  has multiple friends and family members who are on this medication and requested to start it as well as he has heard good results.  I discussed I am happy to start this medication per his request but I need him to closely follow-up with PCP regarding efficacy of this medication and his daily anxiety.  I discussed any side effects or symptoms that could come along with his medication in which patient verbalized understanding.  I also gave patient resources for behavioral health places that do not need a referral in the event that he decides he wants to speak to a therapist. Red flag symptoms discussed with patient and when to be seen in the ER in which he verbalized understanding. - busPIRone  (BUSPAR ) 5 MG tablet; Take 1 tablet (5 mg total) by mouth 2 (two) times daily.  Dispense: 60 tablet; Refill: 0   Patient to reach out to office if new, worrisome, or unresolved symptoms arise or if no improvement in patient's condition. Patient verbalized understanding and is agreeable to treatment plan. All questions answered to patient's satisfaction.    Return in about 6 weeks (around 03/10/2024) for annual physical (fasting labs day of).    Lauraine Almarie Angus DNP, FNP-C     [1] No Known Allergies  "

## 2024-01-28 NOTE — Patient Instructions (Signed)
 For immediate mental health services open 24 hours and no referral needed: [facility], [address] [place], [place] [address], [phone] [facility], [address] [place], [place] [address], [phone] [facility], [address], [place], [place] [address], [phone] Any emergency room or 911  [employer] [employer] [place], [phone]

## 2024-02-09 ENCOUNTER — Telehealth: Admitting: Physician Assistant

## 2024-02-09 DIAGNOSIS — F411 Generalized anxiety disorder: Secondary | ICD-10-CM

## 2024-02-09 DIAGNOSIS — R110 Nausea: Secondary | ICD-10-CM | POA: Diagnosis not present

## 2024-02-09 NOTE — Progress Notes (Signed)
 " Virtual Visit Consent   Wesley Shaw, you are scheduled for a virtual visit with a [facility] provider today. Just as with appointments in the office, your consent must be obtained to participate. Your consent will be active for this visit and any virtual visit you may have with one of our providers in the next 365 days. If you have a MyChart account, a copy of this consent can be sent to you electronically.  As this is a virtual visit, video technology does not allow for your provider to perform a traditional examination. This may limit your provider's ability to fully assess your condition. If your provider identifies any concerns that need to be evaluated in person or the need to arrange testing (such as labs, EKG, etc.), we will make arrangements to do so. Although advances in technology are sophisticated, we cannot ensure that it will always work on either your end or our end. If the connection with a video visit is poor, the visit may have to be switched to a telephone visit. With either a video or telephone visit, we are not always able to ensure that we have a secure connection.  By engaging in this virtual visit, you consent to the provision of healthcare and authorize for your insurance to be billed (if applicable) for the services provided during this visit. Depending on your insurance coverage, you may receive a charge related to this service.  I need to obtain your verbal consent now. Are you willing to proceed with your visit today? Wesley Shaw has provided verbal consent on 02/09/2024 for a virtual visit (video or telephone). Wesley Shaw, [place]  Date: 02/09/2024 12:12 PM   Virtual Visit via Video Note   I, Wesley Shaw, connected with  Wesley Shaw  ([id number], 2000/10/25) on 02/09/24 at 12:00 PM EST by a video-enabled telemedicine application and verified that I am speaking with the correct person using two identifiers.  Location: Patient: Virtual Visit Location  Patient: Home Provider: Virtual Visit Location Provider: Home Office   I discussed the limitations of evaluation and management by telemedicine and the availability of in person appointments. The patient expressed understanding and agreed to proceed.    History of Present Illness: Wesley Shaw is a 24 y.o. who identifies as a male who was assigned male at birth, and is being seen today for panic attack this morning. Notes history of GERD -- recent change in therapy from Famotidine to Protonix . Taking as directed. Some history of anxiety over past few months that he has been discussing with his PCP -- recently started on BuSpar  -- took for a week but stopped -- 1st 4-5 days felt worse than before starting in terms of nausea/stomach upset.  Has PCP follow-up scheduled next week  Notes acute anxiety this morning that lead to some nausea and increased reflux. Took Zofran  at home to helped for nausea.  Has not taken Protonix  yet today. Took TUMS instead. Scheduled appointment because he missed work today.  Denies depressed mood or anhedonia. Denies SI/HI. Is avoiding NSAIDS and alcohol. Does vape but has discussed with his PCP. Denies any severe belly pain or change to bowel habits.  HPI: HPI  Problems:  Patient Active Problem List   Diagnosis Date Noted   Gastroesophageal reflux disease 10/16/2013    Allergies: Allergies[1] Medications: Current Medications[2]  Observations/Objective: Patient is well-developed, well-nourished in no acute distress.  Resting comfortably  at home.  Head is normocephalic, atraumatic.  No labored breathing.  Speech  is clear and coherent with logical content.  Patient is alert and oriented at baseline.   Assessment and Plan: 1. Anxiety state (Primary)  2. Nausea  History of diagnosed GAD but with acute anxiety as well. No active symptoms of depression. Not taking anxiolytic due to noted side effect. Has been following a GERD diet and avoiding triggers but  suspect anxiety has flared this up and the increased GERD symptoms are impacting anxiety as well. Has follow-up next week with PCP, which he is to keep. Ok to continue Zofran  as directed, when needed. Restart Protonix . Hold off on restarting BuSpar  until PCP follow-up. Work note provided. Strict in-person precautions reviewed.  Follow Up Instructions: I discussed the assessment and treatment plan with the patient. The patient was provided an opportunity to ask questions and all were answered. The patient agreed with the plan and demonstrated an understanding of the instructions.  A copy of instructions were sent to the patient via MyChart unless otherwise noted below.   The patient was advised to call back or seek an in-person evaluation if the symptoms worsen or if the condition fails to improve as anticipated.    Wesley Velma Lunger, PA-C    [1] No Known Allergies [2]  Current Outpatient Medications:    busPIRone  (BUSPAR ) 5 MG tablet, Take 1 tablet (5 mg total) by mouth 2 (two) times daily., Disp: 60 tablet, Rfl: 0   ondansetron  (ZOFRAN -ODT) 4 MG disintegrating tablet, DISSOLVE 1 TABLET(4 MG) ON THE TONGUE EVERY 4 HOURS FOR 7 DAYS, Disp: 42 tablet, Rfl: 1   pantoprazole  (PROTONIX ) 40 MG tablet, Take 1 tablet (40 mg total) by mouth daily., Disp: 30 tablet, Rfl: 3  "

## 2024-02-09 NOTE — Patient Instructions (Addendum)
" °  Liza Sero, thank you for joining Elsie Velma Lunger, PA-C for today's virtual visit.  While this provider is not your primary care provider (PCP), if your PCP is located in our provider database this encounter information will be shared with them immediately following your visit.   A [facility] MyChart account gives you access to today's visit and all your visits, tests, and labs performed at [facility]  click here if you don't have a [facility] MyChart account or go to [email or web address]  Consent: (Patient) Wesley Shaw provided verbal consent for this virtual visit at the beginning of the encounter.  Current Medications:  Current Outpatient Medications:    busPIRone  (BUSPAR ) 5 MG tablet, Take 1 tablet (5 mg total) by mouth 2 (two) times daily., Disp: 60 tablet, Rfl: 0   ondansetron  (ZOFRAN -ODT) 4 MG disintegrating tablet, DISSOLVE 1 TABLET(4 MG) ON THE TONGUE EVERY 4 HOURS FOR 7 DAYS, Disp: 42 tablet, Rfl: 1   pantoprazole  (PROTONIX ) 40 MG tablet, Take 1 tablet (40 mg total) by mouth daily., Disp: 30 tablet, Rfl: 3   Medications ordered in this encounter:  No orders of the defined types were placed in this encounter.    *If you need refills on other medications prior to your next appointment, please contact your pharmacy*  Follow-Up: Call back or seek an in-person evaluation if the symptoms worsen or if the condition fails to improve as anticipated.  [facility] [phone]  Other Instructions  Ok to continue Zofran  as directed, when needed. Restart Protonix . Hold off on restarting BuSpar  until PCP follow-up.   If you note any non-resolving, new, or worsening symptoms despite treatment, please seek an in-person evaluation ASAP.    If you have been instructed to have an in-person evaluation today at a local Urgent Care facility, please use the link below. It will take you to a list of all of our available [facility],  including address, phone number and hours of operation. Please do not delay care.  [facility]  If you or a family member do not have a primary care provider, use the link below to schedule a visit and establish care. When you choose a [facility] primary care physician or advanced practice provider, you gain a long-term partner in health. Find a Primary Care Provider  Learn more about [facility]'s in-office and virtual care options:  - Get Care Now  "

## 2024-03-10 ENCOUNTER — Encounter (HOSPITAL_BASED_OUTPATIENT_CLINIC_OR_DEPARTMENT_OTHER)
